# Patient Record
Sex: Female | Born: 2000 | Race: White | Hispanic: No | Marital: Single | Smoking: Never smoker
Health system: Southern US, Community
[De-identification: ages and names within clinical notes are randomized; demographics above are authoritative.]

## PROBLEM LIST (undated history)

## (undated) DIAGNOSIS — N946 Dysmenorrhea, unspecified: Secondary | ICD-10-CM

## (undated) DIAGNOSIS — F419 Anxiety disorder, unspecified: Secondary | ICD-10-CM

## (undated) DIAGNOSIS — F909 Attention-deficit hyperactivity disorder, unspecified type: Secondary | ICD-10-CM

## (undated) DIAGNOSIS — R5383 Other fatigue: Secondary | ICD-10-CM

## (undated) DIAGNOSIS — R519 Headache, unspecified: Secondary | ICD-10-CM

## (undated) DIAGNOSIS — K589 Irritable bowel syndrome without diarrhea: Secondary | ICD-10-CM

## (undated) DIAGNOSIS — K219 Gastro-esophageal reflux disease without esophagitis: Secondary | ICD-10-CM

## (undated) DIAGNOSIS — F32A Depression, unspecified: Secondary | ICD-10-CM

## (undated) DIAGNOSIS — F329 Major depressive disorder, single episode, unspecified: Secondary | ICD-10-CM

## (undated) DIAGNOSIS — F0781 Postconcussional syndrome: Secondary | ICD-10-CM

## (undated) DIAGNOSIS — J45909 Unspecified asthma, uncomplicated: Secondary | ICD-10-CM

## (undated) HISTORY — DX: Depression, unspecified: F32.A

## (undated) HISTORY — DX: Anxiety disorder, unspecified: F41.9

## (undated) HISTORY — DX: Headache, unspecified: R51.9

## (undated) HISTORY — DX: Unspecified asthma, uncomplicated: J45.909

## (undated) HISTORY — DX: Postconcussional syndrome: F07.81

## (undated) HISTORY — DX: Other fatigue: R53.83

## (undated) HISTORY — PX: CHOLECYSTECTOMY: SHX55

## (undated) HISTORY — DX: Dysmenorrhea, unspecified: N94.6

## (undated) HISTORY — DX: Attention-deficit hyperactivity disorder, unspecified type: F90.9

## (undated) HISTORY — PX: COLONOSCOPY WITH ESOPHAGOGASTRODUODENOSCOPY (EGD): SHX5779

---
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
## 2021-01-28 IMAGING — DX LEFT MIDDLE FINGER 2+V
3 series · 3 of 3 positions shown · non-contrast
Comparison: None.

CLINICAL DATA: Foreign body (needle) removed from finger

EXAM:
LEFT MIDDLE FINGER 2+V

[finger ap]
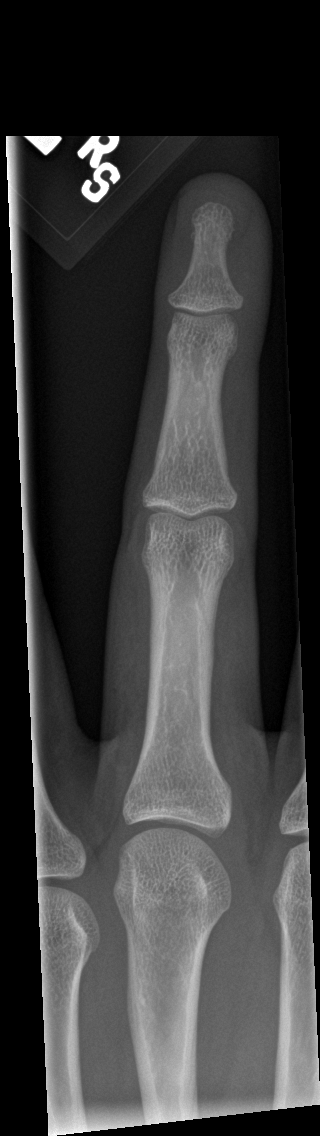

[finger obl]
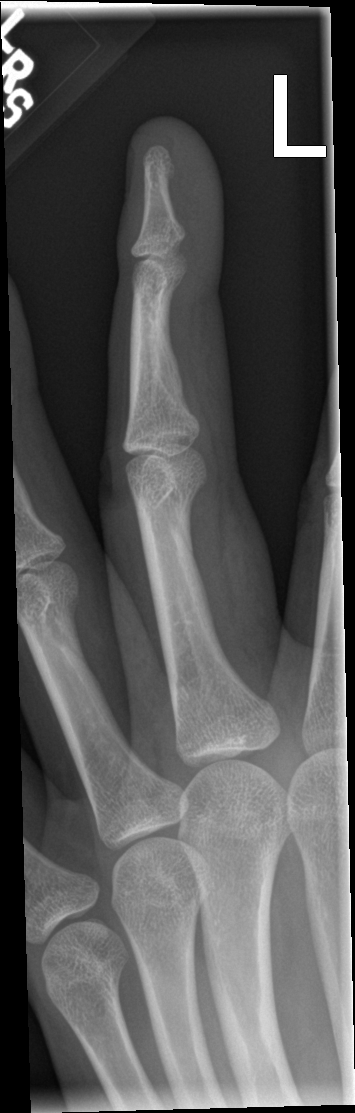

[finger lat]
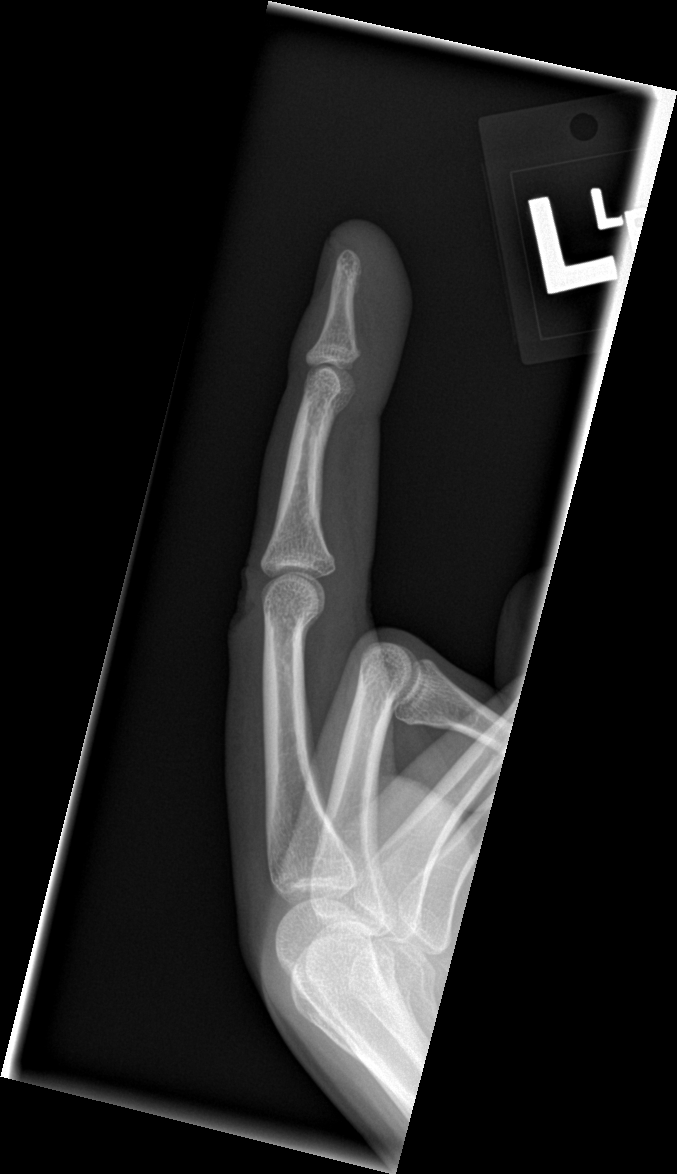

[3 of 3 positions shown; findings below may reference images not displayed]

FINDINGS: No fracture or dislocation is seen.

The joint spaces are preserved.

Mild ventral soft tissue swelling along the distal phalanx.

No radiopaque foreign body is seen.
IMPRESSION: No fracture, dislocation, or radiopaque foreign body is seen.

## 2021-04-19 ENCOUNTER — Other Ambulatory Visit: Payer: Self-pay

## 2021-04-19 ENCOUNTER — Ambulatory Visit: Payer: BC Managed Care – PPO | Admitting: Podiatry

## 2021-04-19 ENCOUNTER — Ambulatory Visit (INDEPENDENT_AMBULATORY_CARE_PROVIDER_SITE_OTHER): Payer: BC Managed Care – PPO

## 2021-04-19 ENCOUNTER — Encounter: Payer: Self-pay | Admitting: Podiatry

## 2021-04-19 DIAGNOSIS — M722 Plantar fascial fibromatosis: Secondary | ICD-10-CM

## 2021-04-19 MED ORDER — DICLOFENAC SODIUM 75 MG PO TBEC: 75.0000 mg | DELAYED_RELEASE_TABLET | Freq: Two times a day (BID) | ORAL | 2 refills | Status: DC

## 2021-04-19 NOTE — Progress Notes (Signed)
Subjective:   Patient ID: Stephanie Kaiser, female   DOB: 20 y.o.   MRN: [id number]   HPI Patient states she is getting pain in her heel left over right also has had trouble with her knees and other joints and did have blood work which did not indicate any form of systemic arthritis.  States the left is much worse than the right   ROS      Objective:  Physical Exam  Neurovascular status intact with patient found to have acute inflammation plantar aspect left heel at the insertional point of the tendon into the calcaneus with inflammation fluid noted with moderate discomfort right with moderate flatfoot deformity     Assessment:  Acute plantar fasciitis left over right also noted to have moderate elongation of the first metatarsal segment bilateral with functional hallux limitus with discomfort in the left first MPJ     Plan:  H&P reviewed all conditions and x-rays and today went ahead dispensed night splint with instructions on usage and also casted for functional orthotics with reverse Morton's extension to try to help reduce the stress on the heel arch and also reduce the stress on the first MPJ.  Patient will be seen back to recheck  X-rays indicate elongated first metatarsal segment left over right moderate depression of the arch

## 2021-04-19 NOTE — Patient Instructions (Signed)

## 2021-05-13 ENCOUNTER — Other Ambulatory Visit: Payer: Self-pay

## 2021-05-13 ENCOUNTER — Ambulatory Visit: Payer: BC Managed Care – PPO | Admitting: Podiatry

## 2021-05-13 ENCOUNTER — Encounter: Payer: Self-pay | Admitting: Podiatry

## 2021-05-13 DIAGNOSIS — M722 Plantar fascial fibromatosis: Secondary | ICD-10-CM | POA: Diagnosis not present

## 2021-05-13 MED ORDER — TRIAMCINOLONE ACETONIDE 10 MG/ML IJ SUSP
20.0000 mg | Freq: Once | INTRAMUSCULAR | Status: AC
  Administered 2021-05-13: 20 mg

## 2021-05-13 MED ORDER — PREDNISONE 10 MG PO TABS: ORAL_TABLET | ORAL | 0 refills | Status: DC

## 2021-05-15 NOTE — Progress Notes (Signed)
Subjective:   Patient ID: Stephanie Kaiser, female   DOB: 21 y.o.   MRN: [id number]   HPI Patient presents with mother stating the heels have been very sore still and the night splint seems to have helped a little bit but not a lot and she just cannot take the pain   ROS      Objective:  Physical Exam  Neurovascular status intact with acute discomfort of the plantar fascial left and the plantar fascial right     Assessment:  Acute Planter fasciitis bilateral     Plan:  Sterile prep injected the plantar fascial bilateral 3 mg Kenalog 5 mg Xylocaine placed on anti-inflammatory discussed continued night splint usage and orthotic should hopefully be here in the next 2 weeks

## 2021-05-20 ENCOUNTER — Telehealth: Payer: Self-pay | Admitting: Podiatry

## 2021-05-20 NOTE — Telephone Encounter (Signed)
Orthotics in... lvm for pt to call to schedule an appt. 

## 2021-05-27 ENCOUNTER — Encounter: Payer: Self-pay | Admitting: Podiatry

## 2021-05-27 ENCOUNTER — Ambulatory Visit: Payer: BC Managed Care – PPO

## 2021-05-27 ENCOUNTER — Other Ambulatory Visit: Payer: Self-pay

## 2021-05-27 ENCOUNTER — Ambulatory Visit: Payer: BC Managed Care – PPO | Admitting: Podiatry

## 2021-05-27 DIAGNOSIS — M722 Plantar fascial fibromatosis: Secondary | ICD-10-CM | POA: Diagnosis not present

## 2021-05-27 NOTE — Progress Notes (Signed)
SITUATION: Reason for Visit: Fitting and Delivery of Custom Fabricated Foot Orthoses Patient Report: Patient reports comfort and is satisfied with device.  OBJECTIVE DATA: Patient History / Diagnosis:     ICD-10-CM   1. Plantar fasciitis, bilateral  M72.2       Provided Device:  Custom Functional Foot Orthotics     [facility] Labs: [id number]  GOAL OF ORTHOSIS - Improve gait - Decrease energy expenditure - Improve Balance - Provide Triplanar stability of foot complex - Facilitate motion  ACTIONS PERFORMED Patient was fit with foot orthotics trimmed to shoe last. Patient tolerated fittign procedure.   Patient was provided with verbal and written instruction and demonstration regarding donning, doffing, wear, care, proper fit, function, purpose, cleaning, and use of the orthosis and in all related precautions and risks and benefits regarding the orthosis.  Patient was also provided with verbal instruction regarding how to report any failures or malfunctions of the orthosis and necessary follow up care. Patient was also instructed to contact our office regarding any change in status that may affect the function of the orthosis.  Patient demonstrated independence with proper donning, doffing, and fit and verbalized understanding of all instructions.  PLAN: Patient is to follow up in one week or as necessary (PRN). All questions were answered and concerns addressed. Plan of care was discussed with and agreed upon by the patient.

## 2021-05-28 NOTE — Progress Notes (Signed)
Subjective:   Patient ID: Stephanie Kaiser, female   DOB: 21 y.o.   MRN: [id number]   HPI Patient presents stating she is improved stating she is here to pick up orthotics and that the pain has responded quite well and is significantly reduced but does have discomfort if she does too much and continues to use night splint but needs a second 1 as it is too difficult to try to use 1 and then the other   ROS      Objective:  Physical Exam  Neurovascular status intact with minimal plantar aspect heel bilateral with inflammation fluid of the tendon that has improved     Assessment:  Plantar fasciitis bilateral improved but still present     Plan:  Advised on the continuation of anti-inflammatories and night splint and I dispensed a second night splint so that she does not have to use just 1 and can use 2 at the same time.  Orthotics dispensed all instructions given and will be seen back as needed

## 2021-08-09 ENCOUNTER — Telehealth: Payer: Self-pay | Admitting: Podiatry

## 2021-08-09 ENCOUNTER — Other Ambulatory Visit: Payer: Self-pay | Admitting: Podiatry

## 2021-08-09 MED ORDER — DICLOFENAC SODIUM 75 MG PO TBEC: 75.0000 mg | DELAYED_RELEASE_TABLET | Freq: Two times a day (BID) | ORAL | 2 refills | Status: DC

## 2021-08-09 NOTE — Telephone Encounter (Signed)
Called in.

## 2021-08-09 NOTE — Telephone Encounter (Signed)
Pharmacy [facility] in [place]  Medication diclofenac (VOLTAREN) 75 MG EC tablet   Patient called requesting refill on medication , please advise.

## 2021-08-09 NOTE — Telephone Encounter (Signed)
Please advise 

## 2021-08-19 ENCOUNTER — Encounter: Payer: Self-pay | Admitting: Podiatry

## 2021-08-19 ENCOUNTER — Ambulatory Visit: Payer: BC Managed Care – PPO | Admitting: Podiatry

## 2021-08-19 DIAGNOSIS — M722 Plantar fascial fibromatosis: Secondary | ICD-10-CM

## 2021-08-20 NOTE — Progress Notes (Signed)
Subjective:  ? ?Patient ID: Stephanie Kaiser, female   DOB: 21 y.o.   MRN: [id number]  ? ?HPI ?Patient states that she is quite a bit better she still has to take an anti-inflammatory periodically but overall she has had good improvement from the discomfort she has had ? ? ?ROS ? ? ?   ?Objective:  ?Physical Exam  ?Neurovascular status intact reduced discomfort in the plantar fascia bilateral still present but quite a bit better than previously ? ?   ?Assessment:  ?Planter fasciitis bilateral with improved but still present ? ?   ?Plan:  ?Reviewed gradual reduction in her anti-inflammatory continue night splint usage which she has not been doing and I encouraged in continued orthotic usage.  Reappoint as symptoms indicate ?   ? ? ?

## 2021-10-30 ENCOUNTER — Other Ambulatory Visit: Payer: Self-pay | Admitting: Podiatry

## 2021-10-30 NOTE — Telephone Encounter (Signed)
Should be fine to refill but does need to follow up with family doctor in future

## 2021-10-30 NOTE — Telephone Encounter (Signed)
I would like to see her before she gets it refilled again

## 2021-11-12 ENCOUNTER — Telehealth: Payer: Self-pay | Admitting: *Deleted

## 2021-11-12 ENCOUNTER — Other Ambulatory Visit: Payer: Self-pay | Admitting: *Deleted

## 2021-11-12 MED ORDER — DICLOFENAC SODIUM 75 MG PO TBEC: 75.0000 mg | DELAYED_RELEASE_TABLET | Freq: Two times a day (BID) | ORAL | 2 refills | Status: DC

## 2021-11-12 NOTE — Telephone Encounter (Signed)
Medication has been sent to pharmacy, patient notified thru voice message.

## 2021-11-12 NOTE — Telephone Encounter (Signed)
Patient is calling to request a refill on medication(diclofenac-75 mg), has an upcoming appointment but going out of town this weekend and would need prescription. Please advise

## 2021-11-25 ENCOUNTER — Ambulatory Visit: Payer: BC Managed Care – PPO | Admitting: Podiatry

## 2021-11-25 DIAGNOSIS — M722 Plantar fascial fibromatosis: Secondary | ICD-10-CM

## 2021-11-25 MED ORDER — DICLOFENAC SODIUM 75 MG PO TBEC: 75.0000 mg | DELAYED_RELEASE_TABLET | Freq: Two times a day (BID) | ORAL | 2 refills | Status: DC

## 2021-11-25 NOTE — Progress Notes (Signed)
Subjective:   Patient ID: Stephanie Kaiser, female   DOB: 21 y.o.   MRN: [id number]   HPI Patient presents stating she is doing better but she is still taking 1 diclofenac a day and is talk to her family physician about this who approves of this.  Patient states that overall her heels are not as bad as they were with the right one still been sore and she does have to wear the Christmas season Toy Shane Crutch was concerned about this   ROS      Objective:  Physical Exam  Neurovascular status found to be intact mild to moderate discomfort plantar fascial band right at the insertional point of the tendon into the calcaneus minimal on the left with patient improving and just taking 1 diclofenac a day     Assessment:  I do think we are showing improvement but there is still an area of pain right plantar fascia     Plan:  Reviewed condition and at this point organ to defer for about 3 months but we may do 1 more injection into the right plantar fascia before the end of the year in the Christmas season and I want her to continue to take 1 pill/day and she will is also work with her family physician on this.  Continue orthotic usage reappoint to recheck

## 2022-02-07 ENCOUNTER — Encounter: Payer: Self-pay | Admitting: Family Medicine

## 2022-02-27 ENCOUNTER — Ambulatory Visit
Admission: RE | Admit: 2022-02-27 | Discharge: 2022-02-27 | Disposition: A | Discharge status: Home / Self Care | Payer: BC Managed Care – PPO | Source: Ambulatory Visit | Attending: Family Medicine | Admitting: Family Medicine

## 2022-02-27 ENCOUNTER — Other Ambulatory Visit: Payer: Self-pay | Admitting: Family Medicine

## 2022-02-27 DIAGNOSIS — M79641 Pain in right hand: Secondary | ICD-10-CM

## 2022-03-10 ENCOUNTER — Encounter: Payer: Self-pay | Admitting: Podiatry

## 2022-03-10 ENCOUNTER — Ambulatory Visit: Payer: BC Managed Care – PPO | Admitting: Podiatry

## 2022-03-10 DIAGNOSIS — M722 Plantar fascial fibromatosis: Secondary | ICD-10-CM

## 2022-03-10 DIAGNOSIS — M76821 Posterior tibial tendinitis, right leg: Secondary | ICD-10-CM

## 2022-03-10 MED ORDER — TRIAMCINOLONE ACETONIDE 10 MG/ML IJ SUSP
20.0000 mg | Freq: Once | INTRAMUSCULAR | Status: AC
  Administered 2022-03-10: 20 mg

## 2022-03-11 NOTE — Progress Notes (Signed)
Subjective:   Patient ID: Stephanie Kaiser, female   DOB: 21 y.o.   MRN: [id number]   HPI Patient states overall she has been doing pretty good but she started to develop discomfort again right over left heel over the last month.  Overall satisfied with the orthotics and how she is doing   ROS      Objective:  Physical Exam  Neurovascular status intact with inflammation pain of the plantar fascia right over left with fluid buildup noted of the medial band at the insertion to the calcaneus     Assessment:  Acute Planter fasciitis which did well for over a 71-month.     Plan:  Reviewed this condition so far she is doing much better with our treatment plan and at this point I went ahead and I did do sterile prep and injected the fascia at insertion bilateral 3 mg Dexasone Kenalog 5 mg Xylocaine and advised on continued orthotic usage.  Patient will be seen back to recheck as needed

## 2022-03-12 ENCOUNTER — Emergency Department (HOSPITAL_BASED_OUTPATIENT_CLINIC_OR_DEPARTMENT_OTHER): Payer: BC Managed Care – PPO | Admitting: Radiology

## 2022-03-12 ENCOUNTER — Emergency Department (HOSPITAL_BASED_OUTPATIENT_CLINIC_OR_DEPARTMENT_OTHER)
Admission: EM | Admit: 2022-03-12 | Discharge: 2022-03-12 | Disposition: A | Discharge status: Home / Self Care | Payer: BC Managed Care – PPO | Attending: Emergency Medicine | Admitting: Emergency Medicine

## 2022-03-12 ENCOUNTER — Other Ambulatory Visit (HOSPITAL_BASED_OUTPATIENT_CLINIC_OR_DEPARTMENT_OTHER): Payer: Self-pay

## 2022-03-12 ENCOUNTER — Encounter (HOSPITAL_BASED_OUTPATIENT_CLINIC_OR_DEPARTMENT_OTHER): Payer: Self-pay

## 2022-03-12 ENCOUNTER — Other Ambulatory Visit: Payer: Self-pay

## 2022-03-12 DIAGNOSIS — M79644 Pain in right finger(s): Secondary | ICD-10-CM | POA: Diagnosis not present

## 2022-03-12 DIAGNOSIS — Z91040 Latex allergy status: Secondary | ICD-10-CM | POA: Diagnosis not present

## 2022-03-12 DIAGNOSIS — M7989 Other specified soft tissue disorders: Secondary | ICD-10-CM | POA: Diagnosis present

## 2022-03-12 LAB — BASIC METABOLIC PANEL
Anion gap: 9 (ref 5–15)
BUN: 13 mg/dL (ref 6–20)
CO2: 24 mmol/L (ref 22–32)
Calcium: 9.4 mg/dL (ref 8.9–10.3)
Chloride: 105 mmol/L (ref 98–111)
Creatinine, Ser: 0.53 mg/dL (ref 0.44–1.00)
GFR, Estimated: >60 mL/min (ref >60)
Glucose, Bld: 85 mg/dL (ref 70–99)
Potassium: 3.8 mmol/L (ref 3.5–5.1)
Sodium: 138 mmol/L (ref 135–145)

## 2022-03-12 LAB — CBC WITH DIFFERENTIAL/PLATELET
Abs Immature Granulocytes: 0.04 10*3/uL (ref 0.00–0.07)
Basophils Absolute: 0.1 10*3/uL (ref 0.0–0.1)
Basophils Relative: 1 %
Eosinophils Absolute: 0.1 10*3/uL (ref 0.0–0.5)
Eosinophils Relative: 1 %
HCT: 32.5 % — ABNORMAL LOW (ref 36.0–46.0)
Hemoglobin: 10.6 g/dL — ABNORMAL LOW (ref 12.0–15.0)
Immature Granulocytes: 0 %
Lymphocytes Relative: 23 %
Lymphs Abs: 2.7 10*3/uL (ref 0.7–4.0)
MCH: 28 pg (ref 26.0–34.0)
MCHC: 32.6 g/dL (ref 30.0–36.0)
MCV: 86 fL (ref 80.0–100.0)
Monocytes Absolute: 1.1 10*3/uL — ABNORMAL HIGH (ref 0.1–1.0)
Monocytes Relative: 9 %
Neutro Abs: 8.1 10*3/uL — ABNORMAL HIGH (ref 1.7–7.7)
Neutrophils Relative %: 66 %
Platelets: 277 10*3/uL (ref 150–400)
RBC: 3.78 MIL/uL — ABNORMAL LOW (ref 3.87–5.11)
RDW: 13 % (ref 11.5–15.5)
WBC: 12 10*3/uL — ABNORMAL HIGH (ref 4.0–10.5)
nRBC: 0 % (ref 0.0–0.2)

## 2022-03-12 MED ORDER — IBUPROFEN 400 MG PO TABS: 600.0000 mg | ORAL_TABLET | Freq: Once | ORAL | Status: DC

## 2022-03-12 MED ORDER — PREDNISONE 10 MG PO TABS
ORAL_TABLET | ORAL | 0 refills | Status: DC
  Filled 2022-03-12: qty 21, 6d supply, fill #0

## 2022-03-12 MED ORDER — ACETAMINOPHEN 500 MG PO TABS
1000.0000 mg | ORAL_TABLET | Freq: Once | ORAL | Status: AC
  Administered 2022-03-12: 1000 mg via ORAL
  Filled 2022-03-12: qty 2

## 2022-03-12 NOTE — ED Triage Notes (Signed)
Pt arrives from home via POV - reports recurrent pain to RUE 4th digit - pt does not recall trauma or injury prior to pain starting --negative outpt xray 02/26/22;  pain and edema worsened suddenly night of arrival.  RUE PMS intact; RUE 4th digit is tight and edematous.

## 2022-03-12 NOTE — ED Notes (Signed)
Hand surgeon now at bedside

## 2022-03-12 NOTE — ED Notes (Signed)
Discharge paperwork given and verbally understood. 

## 2022-03-12 NOTE — ED Provider Notes (Signed)
21 year old female seen and evaluated by Dr. Bernette Mayers with concern for flexor tenosynovitis of right hand. Dr. Janee Morn was consulted by hand surgery Dr. Janee Morn is seen and evaluated patient.  He had requested that chemistry, sed rate, and C-reactive protein be obtained. CBC and chemistry reviewed and within normal limits Dr. Janee Morn has prescribed a prednisone Dosepak He is given patient instructions. He is aware that sed rate and C-reactive protein have not resulted at this time and he will follow-up Given patient return precautions Patient appears stable for discharge from the ED   Margarita Grizzle, MD 03/12/22 [phone]

## 2022-03-12 NOTE — ED Provider Notes (Signed)
[facility] EMERGENCY DEPT  Provider Note  CSN: [id number] Arrival date & time: 03/12/22 0430  History Chief Complaint  Patient presents with   Hand Pain    Stephanie Kaiser is a 21 y.o. female presents to the ED complaining of pain in her R 4th finger. She had similar pain a few weeks ago, saw PCP and had labs done reportedly negative for RA or lupus. An xray done then was also negative. She reported improved pain and thought it had gone away but pain returned yesterday evening and worsened during the night. She has not had any injuries. No skin breaks or nail issues. No fever. Pain is worse with movement.    Home Medications Prior to Admission medications   Medication Sig Start Date End Date Taking? Authorizing Provider  predniSONE (DELTASONE) 10 MG tablet Take as directed over 6 days.  Do not take diclofenac during the 6 days taking prednisone. 03/12/22  Yes Mack Hook, MD  albuterol [facility] HFA) 108 [phone] Base) MCG/ACT inhaler 1 puff as needed 12/18/17   [provider]  atomoxetine (STRATTERA) 25 MG capsule Take 1 capsule (25 mg total) by mouth daily. 08/03/20   Corie Chiquito, PMHNP  diclofenac (VOLTAREN) 75 MG EC tablet Take 1 tablet (75 mg total) by mouth 2 (two) times daily. 11/12/21   McDonald, Rachelle Hora, DPM  diclofenac (VOLTAREN) 75 MG EC tablet Take 1 tablet (75 mg total) by mouth 2 (two) times daily. 11/25/21   Lenn Sink, DPM  DULoxetine (CYMBALTA) 30 MG capsule Take 1 capsule (30 mg total) by mouth at bedtime. 07/27/20   Corie Chiquito, PMHNP  etonogestrel-ethinyl estradiol (NUVARING) 0.12-0.015 MG/24HR vaginal ring 1 ring leave in place for 3 weeks, remove, and replace with a new ring after 7 day break 07/14/19   [provider]  omeprazole (PRILOSEC) 20 MG capsule  02/18/18   [provider]  ondansetron (ZOFRAN ODT) 4 MG disintegrating tablet Take 1 tablet (4 mg total) by mouth every 8 (eight) hours as needed for nausea or  vomiting. 10/22/20   Farrel Gordon, PA-C  predniSONE (DELTASONE) 10 MG tablet 12 day tapering dose 05/13/21   Lenn Sink, DPM  Promethazine HCl POWD See admin instructions. 03/03/18   [provider]  Suvorexant (BELSOMRA) 10 MG TABS Take 10 mg by mouth at bedtime as needed. 07/27/20   Corie Chiquito, PMHNP  Suvorexant (BELSOMRA) 15 MG TABS Take 15 mg by mouth at bedtime. 06/14/20   Corie Chiquito, PMHNP     Allergies    Cephalexin, Latex, and Penicillins   Review of Systems   Review of Systems Please see HPI for pertinent positives and negatives  Physical Exam BP (!) 91/57 (BP Location: Right Arm)   Pulse 67   Temp 98.6 F (37 C) (Oral)   Resp 16   Ht 5\' 8"  (1.727 m)   Wt 62.6 kg   LMP 02/05/2022 (Approximate)   SpO2 99%   BMI 20.98 kg/m   Physical Exam Vitals and nursing note reviewed.  HENT:     Head: Normocephalic.     Nose: Nose normal.  Eyes:     Extraocular Movements: Extraocular movements intact.  Pulmonary:     Effort: Pulmonary effort is normal.  Musculoskeletal:     Cervical back: Neck supple.     Comments: There is symmetric fusiform swelling of the R 4th finger, primarily around the PIP joint, held in partial flexion. Pain with passive flexion as well as flexion against  resistance. Some erythema on the palmar aspect of the finger, see photo below  Skin:    Findings: No rash (on exposed skin).  Neurological:     Mental Status: She is alert and oriented to person, place, and time.  Psychiatric:        Mood and Affect: Mood normal.     ED Results / Procedures / Treatments   EKG None  Procedures Procedures  Medications Ordered in the ED Medications  acetaminophen (TYLENOL) tablet 1,000 mg (1,000 mg Oral Given 03/12/22 [id number])    Initial Impression and Plan  Patient with exam concerning for a flexor tenosynovitis. No obvious source for an infectious cause by history or exam. Will recheck xray to compare to recent films and evaluate for  erosive signs. Will need Hand evaluation.   ED Course   Clinical Course as of 03/14/22 1630  Wed Mar 12, 2022  0603 I personally viewed the images from radiology studies and agree with radiologist interpretation: Xray shows soft tissue swelling only. Will discuss with Hand.   [CS]  [phone] Spoke with Dr. Grandville Silos, Hand, who requests labs be drawn including CBC, CRP and Sed rate and he will come to the ED to evaluate the patient. Motrin ordered for pain. Patient and mother at bedside aware of the plan.  [CS]  [phone] Patient requests APAP instead of Motrin.  [CS]  0700 Dr. Grandville Silos at bedside. Care of the patient will be signed out to Dr. Jeanell Sparrow at the change of shift.  [CS]  0745 Discussed with Dr. Grandville Silos who has seen and evaluated patient and will follow up.  He has given prednisone pack.  He will follow up in office and has given patient return precautions.  [DR]    Clinical Course User Index [CS] Truddie Hidden, MD [DR] Pattricia Boss, MD     MDM Rules/Calculators/A&P Medical Decision Making Problems Addressed: Finger swelling: acute illness or injury  Amount and/or Complexity of Data Reviewed Labs: ordered. Radiology: ordered and independent interpretation performed. Decision-making details documented in ED Course.  Risk OTC drugs. Prescription drug management.    Final Clinical Impression(s) / ED Diagnoses Final diagnoses:  Finger swelling    Rx / DC Orders ED Discharge Orders          Ordered    predniSONE (DELTASONE) 10 MG tablet        03/12/22 0732             Truddie Hidden, MD 03/14/22 1630

## 2022-03-12 NOTE — Consult Note (Addendum)
ORTHOPAEDIC CONSULTATION HISTORY & PHYSICAL REQUESTING PHYSICIAN: Truddie Hidden, MD  Chief Complaint: Swollen right ring finger  HPI: Stephanie Kaiser is a 21 y.o. female who presented for evaluation of the swollen and small right ring finger.  She presents accompanied by her mother.  She reports that the digit became a little swollen and sore about 2 weeks ago, without specific trauma, prompting some investigation with some laboratories drawn by her PCP.  She reports that the blood work was apparently not concerning.  This largely resolved, but then became swollen and increasingly painful beginning yesterday.  Again there is been no recent history of trauma nor discernible breaks in the skin.  She does have a family history of inflammatory disease, and she herself has been on diclofenac for the better part of the year due to multiple arthralgias and soft tissue pain.  Apparently all investigations for possible inflammatory causes have been negative.  There have been no joint aspirates for uric acid crystals however.  She has taken prednisone at times, which has been helpful in alleviating her symptoms.  Past Medical History:  Diagnosis Date   ADHD (attention deficit hyperactivity disorder)    Allergic rhinitis with asthma without status asthmaticus without complication    Anxiety    Asthma    Depression    Dysmenorrhea in adolescent    Fatigue    GERD (gastroesophageal reflux disease)    Headache    IBS (irritable bowel syndrome)    Post concussion syndrome    Past Surgical History:  Procedure Laterality Date   ADENOIDECTOMY  2004   CHOLECYSTECTOMY     COLONOSCOPY WITH ESOPHAGOGASTRODUODENOSCOPY (EGD)     TYMPANOSTOMY TUBE PLACEMENT  2004   Social History   Socioeconomic History   Marital status: Single    Spouse name: Not on file   Number of children: Not on file   Years of education: Not on file   Highest education level: 11th grade  Occupational History   Occupation:  student  Tobacco Use   Smoking status: Never   Smokeless tobacco: Never  Vaping Use   Vaping Use: Never used  Substance and Sexual Activity   Alcohol use: Never   Drug use: Never   Sexual activity: Not on file  Other Topics Concern   Not on file  Social History Narrative   Not on file   Social Determinants of Health   Financial Resource Strain: Not on file  Food Insecurity: Not on file  Transportation Needs: Not on file  Physical Activity: Not on file  Stress: Not on file  Social Connections: Not on file   Family History  Problem Relation Age of Onset   Depression Mother    Depression Father    Post-traumatic stress disorder Father    ADD / ADHD Brother    Depression Brother    Tics Brother    Alcohol abuse Paternal Aunt    Alcohol abuse Paternal Uncle    Alcohol abuse Paternal Grandfather    Allergies  Allergen Reactions   Cephalexin Nausea And Vomiting and Nausea Only   Latex Rash and Other (See Comments)   Penicillins Rash   Prior to Admission medications   Medication Sig Start Date End Date Taking? Authorizing Provider  albuterol (PROAIR HFA) 108 (90 Base) MCG/ACT inhaler 1 puff as needed 12/18/17   [provider]  atomoxetine (STRATTERA) 25 MG capsule Take 1 capsule (25 mg total) by mouth daily. 08/03/20   Thayer Headings, PMHNP  diclofenac (  VOLTAREN) 75 MG EC tablet Take 1 tablet (75 mg total) by mouth 2 (two) times daily. 11/12/21   McDonald, Stephan Minister, DPM  diclofenac (VOLTAREN) 75 MG EC tablet Take 1 tablet (75 mg total) by mouth 2 (two) times daily. 11/25/21   Wallene Huh, DPM  DULoxetine (CYMBALTA) 30 MG capsule Take 1 capsule (30 mg total) by mouth at bedtime. 07/27/20   Thayer Headings, PMHNP  etonogestrel-ethinyl estradiol (NUVARING) 0.12-0.015 MG/24HR vaginal ring 1 ring leave in place for 3 weeks, remove, and replace with a new ring after 7 day break 07/14/19   [provider]  omeprazole (PRILOSEC) 20 MG capsule  02/18/18   [provider]  ondansetron (ZOFRAN ODT) 4 MG disintegrating tablet Take 1 tablet (4 mg total) by mouth every 8 (eight) hours as needed for nausea or vomiting. 10/22/20   Alfredia Client, PA-C  predniSONE (DELTASONE) 10 MG tablet 12 day tapering dose 05/13/21   Wallene Huh, DPM  Promethazine HCl POWD See admin instructions. 03/03/18   [provider]  Suvorexant (BELSOMRA) 10 MG TABS Take 10 mg by mouth at bedtime as needed. 07/27/20   Thayer Headings, PMHNP  Suvorexant (BELSOMRA) 15 MG TABS Take 15 mg by mouth at bedtime. 06/14/20   Thayer Headings, PMHNP   DG Finger Ring Right  Result Date: 03/12/2022 CLINICAL DATA:  21 year old female with increasing right hand 4th finger pain this month, no known injury. EXAM: RIGHT RING FINGER 2+V COMPARISON:  Right hand series 02/27/2022. FINDINGS: Bone mineralization, joint spaces and alignment remain normal. There does appear to be 4th finger soft tissue swelling, mostly volar. No soft tissue gas. No radiopaque foreign body identified. No osseous abnormality identified. IMPRESSION: Soft tissue swelling with no osseous abnormality identified. Electronically Signed   By: Genevie Ann M.D.   On: 03/12/2022 05:58    Positive ROS: All other systems have been reviewed and were otherwise negative with the exception of those mentioned in the HPI and as above.  Physical Exam: Vitals: Refer to EMR.--Afebrile Constitutional:  WD, WN, NAD HEENT:  NCAT, EOMI Neuro/Psych:  Alert & oriented to person, place, and time; appropriate mood & affect Lymphatic: No generalized extremity edema or lymphadenopathy Extremities / MSK:  The extremities are normal with respect to appearance, ranges of motion, joint stability, muscle strength/tone, sensation, & perfusion except as otherwise noted:  The right ring finger is mildly swollen.  There are some tenderness in the volar soft tissues to palpation overlying largely the middle and proximal phalanges and somewhat into the palm.   The swelling is not fusiform.  The digit lies nearly fully extended and can be fully extended with only mild to moderate increase in soreness.  There is minimal tenderness proximal to the base of the digit.  The digit is brought into a more flexed position at the MP joint with the wrist flexed, taking tension off of the flexor tendon, there is still pain referable to the PIP joint with passive motion, and diminished motion daily as well.  WBC 12.0, ESR & CRP pending  Assessment: Right ring finger pain and swelling of unclear etiology.  Infectious etiology is very low probability  Plan: I discussed these findings with her and her mother.  I reviewed possibilities.  Infectious etiology is low.  Ultimately, after careful consideration and deliberation of multiple options ranging from observation, to anti-inflammatory treatment antibiotic treatment will combinations thereof,  we decided to proceed with a 6-day prednisone pack, holding diclofenac, and observing  for response.  She was encouraged that if her condition worsens over the next 24 to 48 hours to seek reevaluation, and given timing related to the holiday, this would require reevaluation in the emergency department.  She and her mom verbalized agreement with this plan, and a 6-day prednisone pack was electronically prescribed for her.  Questions were invited and answered.  Rayvon Char Grandville Silos, [address] [place], [place]  [address] Office: [phone] Mobile: [phone]  03/12/2022, 6:25 AM

## 2022-03-12 NOTE — Discharge Instructions (Addendum)
DO NOT TAKE DICLOFENAC DURING THE 6 DAYS YOU ARE TAKING PREDNISONE.  YOU CAN RESUME ON DAY 7  Return to the ED if your condition worsens over the next 24-48 hours.

## 2022-03-12 NOTE — ED Notes (Signed)
Pt reports taken Zanaflex 4mg  0200hrs and Diclofenac Sodium tab 75mg  approx 2200hrs

## 2022-03-12 NOTE — ED Notes (Signed)
Pt reports she prefers tylenol instead of motrin as she is on diclofenac and was advised not to take motrin - Dr Bernette Mayers notified and subsequently has ordered tylenol (see MAR for med administration)

## 2022-04-23 ENCOUNTER — Other Ambulatory Visit: Payer: BC Managed Care – PPO

## 2022-06-14 ENCOUNTER — Other Ambulatory Visit: Payer: Self-pay | Admitting: Podiatry

## 2022-07-11 ENCOUNTER — Other Ambulatory Visit: Payer: Self-pay | Admitting: Podiatry

## 2022-07-16 ENCOUNTER — Encounter: Payer: Self-pay | Admitting: Podiatry

## 2022-07-16 ENCOUNTER — Other Ambulatory Visit: Payer: Self-pay | Admitting: Podiatry

## 2022-07-16 MED ORDER — DICLOFENAC SODIUM 75 MG PO TBEC: 75.0000 mg | DELAYED_RELEASE_TABLET | Freq: Two times a day (BID) | ORAL | 2 refills | Status: DC

## 2022-07-23 ENCOUNTER — Ambulatory Visit: Payer: BC Managed Care – PPO | Admitting: Podiatry

## 2022-07-28 ENCOUNTER — Ambulatory Visit: Payer: BC Managed Care – PPO | Admitting: Podiatry

## 2022-07-28 ENCOUNTER — Encounter: Payer: Self-pay | Admitting: Podiatry

## 2022-07-28 ENCOUNTER — Ambulatory Visit (INDEPENDENT_AMBULATORY_CARE_PROVIDER_SITE_OTHER): Payer: BC Managed Care – PPO

## 2022-07-28 DIAGNOSIS — M722 Plantar fascial fibromatosis: Secondary | ICD-10-CM

## 2022-07-28 MED ORDER — TRIAMCINOLONE ACETONIDE 10 MG/ML IJ SUSP
10.0000 mg | Freq: Once | INTRAMUSCULAR | Status: AC
  Administered 2022-07-28: 10 mg

## 2022-07-28 MED ORDER — DICLOFENAC SODIUM 75 MG PO TBEC: 75.0000 mg | DELAYED_RELEASE_TABLET | Freq: Two times a day (BID) | ORAL | 2 refills | Status: DC

## 2022-07-29 NOTE — Progress Notes (Signed)
Subjective:   Patient ID: Stephanie Kaiser, female   DOB: 22 y.o.   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

## 2023-01-08 ENCOUNTER — Ambulatory Visit: Payer: BC Managed Care – PPO | Admitting: Podiatry

## 2023-02-12 ENCOUNTER — Other Ambulatory Visit: Payer: Self-pay

## 2023-02-12 ENCOUNTER — Other Ambulatory Visit (HOSPITAL_BASED_OUTPATIENT_CLINIC_OR_DEPARTMENT_OTHER): Payer: Self-pay

## 2023-02-12 MED ORDER — FLUTICASONE PROPIONATE 50 MCG/ACT NA SUSP
2.0000 | Freq: Every day | NASAL | 3 refills | Status: AC
  Filled 2023-02-12: qty 16, 25d supply, fill #0

## 2023-02-12 MED ORDER — LISDEXAMFETAMINE DIMESYLATE 10 MG PO CAPS
10.0000 mg | ORAL_CAPSULE | Freq: Every morning | ORAL | 0 refills | Status: DC
  Filled 2023-02-12: qty 30, 30d supply, fill #0

## 2023-02-12 MED ORDER — TIZANIDINE HCL 4 MG PO TABS
4.0000 mg | ORAL_TABLET | Freq: Every evening | ORAL | 0 refills | Status: AC | PRN
  Filled 2023-02-12: qty 90, 90d supply, fill #0

## 2023-03-04 ENCOUNTER — Encounter: Payer: Self-pay | Admitting: Psychiatry

## 2023-03-30 ENCOUNTER — Other Ambulatory Visit (HOSPITAL_BASED_OUTPATIENT_CLINIC_OR_DEPARTMENT_OTHER): Payer: Self-pay

## 2023-03-30 MED ORDER — DULOXETINE HCL 60 MG PO CPEP
60.0000 mg | ORAL_CAPSULE | Freq: Every day | ORAL | 3 refills | Status: DC
  Filled 2023-03-30: qty 30, 30d supply, fill #0

## 2023-03-30 MED ORDER — DICLOFENAC SODIUM 75 MG PO TBEC
75.0000 mg | DELAYED_RELEASE_TABLET | Freq: Every day | ORAL | 1 refills | Status: DC | PRN
  Filled 2023-03-30: qty 30, 30d supply, fill #0

## 2023-04-09 ENCOUNTER — Other Ambulatory Visit (HOSPITAL_BASED_OUTPATIENT_CLINIC_OR_DEPARTMENT_OTHER): Payer: Self-pay

## 2023-05-15 ENCOUNTER — Other Ambulatory Visit: Payer: Self-pay | Admitting: Nurse Practitioner

## 2023-05-15 ENCOUNTER — Other Ambulatory Visit (HOSPITAL_COMMUNITY)
Admission: RE | Admit: 2023-05-15 | Discharge: 2023-05-15 | Disposition: A | Discharge status: Home / Self Care | Payer: BC Managed Care – PPO | Source: Ambulatory Visit | Attending: Nurse Practitioner | Admitting: Nurse Practitioner

## 2023-05-15 ENCOUNTER — Other Ambulatory Visit: Payer: Self-pay

## 2023-05-15 ENCOUNTER — Other Ambulatory Visit (HOSPITAL_BASED_OUTPATIENT_CLINIC_OR_DEPARTMENT_OTHER): Payer: Self-pay

## 2023-05-15 DIAGNOSIS — Z124 Encounter for screening for malignant neoplasm of cervix: Secondary | ICD-10-CM | POA: Insufficient documentation

## 2023-05-15 MED ORDER — ETONOGESTREL-ETHINYL ESTRADIOL 0.12-0.015 MG/24HR VA RING
VAGINAL_RING | VAGINAL | 3 refills | Status: DC
  Filled 2023-05-15: qty 1, 28d supply, fill #0
  Filled 2023-05-15: qty 3, 84d supply, fill #0
  Filled 2023-06-17: qty 1, 28d supply, fill #1

## 2023-05-20 LAB — CYTOLOGY - PAP: Diagnosis: NEGATIVE

## 2023-06-08 ENCOUNTER — Other Ambulatory Visit (HOSPITAL_BASED_OUTPATIENT_CLINIC_OR_DEPARTMENT_OTHER): Payer: Self-pay

## 2023-06-08 MED ORDER — DICLOFENAC SODIUM 75 MG PO TBEC
75.0000 mg | DELAYED_RELEASE_TABLET | Freq: Every day | ORAL | 1 refills | Status: DC | PRN
  Filled 2023-06-08: qty 30, 30d supply, fill #0

## 2023-06-08 MED ORDER — DULOXETINE HCL 60 MG PO CPEP
60.0000 mg | ORAL_CAPSULE | Freq: Every day | ORAL | 3 refills | Status: DC
  Filled 2023-06-08: qty 30, 30d supply, fill #0

## 2023-06-08 MED ORDER — PREDNISONE 20 MG PO TABS
ORAL_TABLET | ORAL | 0 refills | Status: DC
  Filled 2023-06-08: qty 18, 9d supply, fill #0

## 2023-06-17 ENCOUNTER — Other Ambulatory Visit (HOSPITAL_BASED_OUTPATIENT_CLINIC_OR_DEPARTMENT_OTHER): Payer: Self-pay

## 2023-06-18 ENCOUNTER — Other Ambulatory Visit: Payer: Self-pay

## 2023-06-18 ENCOUNTER — Other Ambulatory Visit (HOSPITAL_BASED_OUTPATIENT_CLINIC_OR_DEPARTMENT_OTHER): Payer: Self-pay

## 2023-06-19 ENCOUNTER — Other Ambulatory Visit (HOSPITAL_BASED_OUTPATIENT_CLINIC_OR_DEPARTMENT_OTHER): Payer: Self-pay

## 2023-06-20 ENCOUNTER — Other Ambulatory Visit (HOSPITAL_BASED_OUTPATIENT_CLINIC_OR_DEPARTMENT_OTHER): Payer: Self-pay

## 2023-06-29 ENCOUNTER — Other Ambulatory Visit (HOSPITAL_BASED_OUTPATIENT_CLINIC_OR_DEPARTMENT_OTHER): Payer: Self-pay

## 2024-03-30 NOTE — Progress Notes (Signed)
 "  Office Visit Note  Patient: Stephanie Kaiser             Date of Birth: 2000-10-09           MRN: [id number]             PCP: Teresa Channel, MD Referring: Teresa Channel, MD Visit Date: 04/01/2024 Occupation: Data Unavailable  Subjective:  Discussed the use of AI scribe software for clinical note transcription with the patient, who gave verbal consent to proceed.  History of Present Illness   Stephanie Kaiser is a 23 year old female who presents with episodic joint swelling and pain associated with elevated sed rate and CRP and positive HLA-B27 allele.  She has experienced episodic joint swelling and pain for approximately three years, initially affecting her right ring finger, which led to an ER visit and a prescription for steroids. Blood work was conducted to rule out infection. She saw a hand surgeon who treated her for a suspected ruptured tendon, and she underwent physical therapy, which provided some relief.  Over the past three years, she has had similar swelling episodes in various joints, including her toes and fingers, without identifiable injury. These episodes last two to three months, with unbearable pain for about two weeks before gradually improving. She manages the pain with braces and by limiting movement.  She experiences occasional knee swelling, requiring a knee brace. The pain worsens with weight-bearing activities and can be constant when severe. She has been taking ibuprofen  consistently for the past six months, which provides some relief. Prior to this, she was on diclofenac .  No significant redness or rash is associated with the swelling, though mild redness occurs when the swelling is severe. She reports general joint stiffness and cold hands, but no color changes. She has a history of plantar fasciitis, treated with injections approximately three years ago.  Her family history includes arthritis in her mother and gout in her grandfather. She denies any  significant past illnesses or infections related to her symptoms. She reports small cuts around her nose but no other skin changes or rashes.  She notes hip pain, particularly on the left side, present for about a year. This pain prevents her from sleeping on her left side and worsens after periods of inactivity, improving after about five minutes of movement. No specific injury to the hip is reported.  She works at [employer], involving physical activities such as lifting and computer work. 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     She has not noticed any new vision changes or eye pain or inflammation. Experiences some mixed constipation and diarrhea without seeing frank blood or mucus stools.   Labs reviewed 05/2023 HLA-B27 pos ESR 33 CRP 23 RF neg CCP neg  Activities of Daily Living:  Patient reports morning stiffness for 30-40 minutes.   Patient Reports nocturnal pain.  Difficulty dressing/grooming: Reports Difficulty climbing stairs: Reports Difficulty getting out of chair: Reports Difficulty using hands for taps, buttons, cutlery, and/or writing: Reports  Review of Systems  Constitutional:  Positive for fatigue.  HENT:  Negative for mouth sores and mouth dryness.   Eyes:  Negative for dryness.  Respiratory:  Positive for shortness of breath.   Cardiovascular:  Positive for palpitations. Negative for chest pain.  Gastrointestinal:  Positive for constipation and diarrhea. Negative for blood in stool.  Endocrine: Negative for increased urination.  Genitourinary:  Negative for involuntary urination.  Musculoskeletal:  Positive for joint pain, gait problem, joint pain, joint swelling, myalgias, morning stiffness and myalgias. Negative for muscle weakness and muscle tenderness.  Skin:  Negative for color change, rash, hair loss and sensitivity to sunlight.  Allergic/Immunologic: Positive for susceptible to infections.  Neurological:   Positive for headaches. Negative for dizziness.  Hematological:  Negative for swollen glands.  Psychiatric/Behavioral:  Positive for depressed mood and sleep disturbance. The patient is nervous/anxious.     PMFS History:  Patient Active Problem List   Diagnosis Date Noted   Acne vulgaris 04/01/2024   Allergic rhinitis 04/01/2024   Anxiety 04/01/2024   Inflammatory arthritis 04/01/2024   Irregular menses 04/01/2024   Migraine 04/01/2024   Mild intermittent asthma 04/01/2024   Plantar fascial fibromatosis 04/01/2024   Vitamin B12 deficiency (non anemic) 04/01/2024   Vitamin D deficiency 04/01/2024   Dysmenorrhea 04/01/2024   Gastroesophageal reflux disease without esophagitis 04/01/2024   Elevated sed rate 04/01/2024   Chronic low back pain 04/01/2024   Inflammatory back pain 04/01/2024   Panic disorder 04/13/2019   Generalized anxiety disorder 04/13/2019   Social anxiety disorder 04/13/2019   Attention deficit hyperactivity disorder (ADHD), inattentive type, 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   Persistent depressive disorder with atypical features, 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   Postconcussional syndrome 07/15/2017   Acute post-traumatic headache, not intractable 07/15/2017   Poor concentration 07/15/2017    Past Medical History:  Diagnosis Date   ADHD (attention deficit hyperactivity disorder)    Allergic rhinitis with asthma without status asthmaticus without complication    Anxiety    Asthma    Depression    Dysmenorrhea in adolescent    Fatigue    GERD (gastroesophageal reflux disease)    Headache    IBS (irritable bowel syndrome)    Post concussion syndrome     Family History  Problem Relation Age of Onset   Depression Mother    Depression Father    Post-traumatic stress disorder Father    ADD / ADHD Brother    Depression Brother    Tics Brother    Alcohol abuse Paternal Aunt    Alcohol abuse Paternal Uncle    Alcohol abuse Paternal Grandfather    Past Surgical History:   Procedure Laterality Date   ADENOIDECTOMY  2004   CHOLECYSTECTOMY     COLONOSCOPY WITH ESOPHAGOGASTRODUODENOSCOPY (EGD)     TYMPANOSTOMY TUBE PLACEMENT  2004   Social History   Tobacco Use   Smoking status: Never    Passive exposure: Never   Smokeless tobacco: Never  Vaping Use   Vaping status: Never Used  Substance Use Topics   Alcohol use: Never   Drug use: Never   Social History   Social History Narrative   Not on file     Immunization History  Administered Date(s) Administered   DTaP 11/09/2001, 01/03/2002, 03/30/2002, 12/14/2002, 11/28/2005   Fluzone Influenza virus vaccine,trivalent (IIV3), split virus 02/17/2003, 02/13/2004, 02/17/2005, 01/30/2006, 03/17/2007, 03/01/2008, 03/01/2009, 03/01/2010, 03/03/2011, 02/02/2012, 02/28/2013, 01/16/2014, 02/08/2015, 04/02/2016, 02/02/2017   HIB (PRP-T) 11/09/2001, 01/03/2002, 03/30/2002, 12/14/2002   HPV 9-valent 04/12/2014, 06/15/2014, 11/07/2014   Hepatitis A, Ped/Adol-2 Dose 11/28/2005, 12/02/2006   Hepatitis B, PED/ADOLESCENT 09/10/01, 10/12/2001, 07/04/2002   IPV 11/09/2001, 01/03/2002, 11/28/2005   Influenza, Quadrivalent, Recombinant, Inj, Pf 02/10/2019   Influenza,inj,Quad PF,6+ Mos 04/23/2018   Influenza,trivalent, recombinat, inj, PF 02/12/2023   MMR 10/03/2002, 11/28/2005   Meningococcal Acwy, Unspecified 10/15/2012   Meningococcal B, OMV 02/10/2019, 06/29/2019   Meningococcal Conjugate 02/10/2019   Moderna Sars-Covid-2 Vaccination 04/26/2020  Pfizer(Comirnaty)Fall Seasonal Vaccine 12 years and older 07/26/2019, 08/16/2019   Pneumococcal Conjugate PCV 7 11/09/2001, 01/03/2002, 12/14/2002, 09/26/2003   Tdap 10/15/2012, 11/16/2018   Varicella 10/03/2002, 11/28/2005     Objective: Vital Signs: BP 94/62 (BP Location: Right Arm, Patient Position: Sitting, Cuff Size: Normal)   Pulse 69   Temp 98 F (36.7 C)   Resp 15   Ht 5' 7.5 (1.715 m)   Wt 136 lb 12.8 oz (62.1 kg)   BMI 21.11 kg/m    Physical  Exam Eyes:     Conjunctiva/sclera: Conjunctivae normal.  Cardiovascular:     Rate and Rhythm: Normal rate and regular rhythm.  Pulmonary:     Effort: Pulmonary effort is normal.     Breath sounds: Normal breath sounds.  Musculoskeletal:     Right lower leg: No edema.     Left lower leg: No edema.  Lymphadenopathy:     Cervical: No cervical adenopathy.  Skin:    General: Skin is warm and dry.     Findings: No rash.  Neurological:     Mental Status: She is alert.  Psychiatric:        Mood and Affect: Mood normal.      Musculoskeletal Exam:  Shoulders full ROM no tenderness or swelling Elbows full ROM no tenderness or swelling Wrists full ROM no tenderness or swelling Fingers full ROM no tenderness or swelling Knees full ROM no tenderness or swelling Nodules at Achilles tendon insertion on right foot. Decreased range of motion in right big toe.     Investigation: No additional findings.  Imaging: No results found.  Recent Labs: Lab Results  Component Value Date   WBC 6.5 04/01/2024   HGB 12.2 04/01/2024   PLT 260 04/01/2024   NA 138 04/01/2024   K 4.3 04/01/2024   CL 104 04/01/2024   CO2 27 04/01/2024   GLUCOSE 85 04/01/2024   BUN 13 04/01/2024   CREATININE 0.51 04/01/2024   BILITOT 0.5 04/01/2024   AST 22 04/01/2024   ALT 15 04/01/2024   PROT 7.3 04/01/2024   CALCIUM 9.2 04/01/2024    Speciality Comments: No specialty comments available.  Procedures:  No procedures performed Allergies: Cephalexin, Latex, and Penicillins   Assessment / Plan:     Visit Diagnoses:  Assessment & Plan Inflammatory arthritis Inflammatory back pain Elevated sed rate Intermittent swelling and pain in fingers, toes, and knees for three years. High sedimentation rate indicates inflammation. Differential most suspicious for axial spondyloarthritis/ERA. Negative for RA antibodies. Ibuprofen  provides some relief. Previous doxycycline trial discontinued due to side effects. No  evidence of gout. - Ordered blood tests to recheck inflammatory markers. - Continue ibuprofen  for symptom management. - Consider sulfasalazine next step if symptoms worsen or not adequate control with NSAIDs.   Orders:   Sedimentation rate   C-reactive protein   CBC with Differential/Platelet   Comprehensive metabolic panel with GFR   XR Pelvis 1-2 Views   XR Lumbar Spine 2-3 Views  Plantar fascial fibromatosis Recurrent plantar fasciitis with previous injections. Pain exacerbated by prolonged standing or walking. Possible contribution from altered gait due to big toe mobility issues. - Continue current management with braces and rest as needed.    Vitamin D deficiency     Chronic bilateral low back pain without sciatica Chronic bilateral hip pain, more pronounced on the left side, persisting for about a year. Pain worsens with prolonged sitting and improves with movement. Differential includes axial spondyloarthritis, tendinitis, bursitis, or labral pathology.  Excellent range of motion suggests against osteoarthritis. - Ordered x-ray of hips and pelvis to evaluate for structural abnormalities. - Consider further evaluation with ultrasound if symptoms persist or worsen.        Follow-Up Instructions: Return in about 6 weeks (around 05/13/2024) for New pt ?AS on NSAIDs f/u 1-72mos.   Lonni LELON Ester, MD  Note - This record has been created using [employer].  Chart creation errors have been sought, but may not always  have been located. Such creation errors do not reflect on  the standard of medical care. "

## 2024-04-01 ENCOUNTER — Encounter: Payer: Self-pay | Admitting: Internal Medicine

## 2024-04-01 ENCOUNTER — Ambulatory Visit: Attending: Internal Medicine | Admitting: Internal Medicine

## 2024-04-01 ENCOUNTER — Ambulatory Visit

## 2024-04-01 VITALS — BP 94/62 | HR 69 | Temp 98.0°F | Resp 15 | Ht 67.5 in | Wt 136.8 lb

## 2024-04-01 DIAGNOSIS — F419 Anxiety disorder, unspecified: Secondary | ICD-10-CM | POA: Insufficient documentation

## 2024-04-01 DIAGNOSIS — G8929 Other chronic pain: Secondary | ICD-10-CM | POA: Diagnosis not present

## 2024-04-01 DIAGNOSIS — R700 Elevated erythrocyte sedimentation rate: Secondary | ICD-10-CM | POA: Diagnosis not present

## 2024-04-01 DIAGNOSIS — N946 Dysmenorrhea, unspecified: Secondary | ICD-10-CM | POA: Insufficient documentation

## 2024-04-01 DIAGNOSIS — M5489 Other dorsalgia: Secondary | ICD-10-CM

## 2024-04-01 DIAGNOSIS — E538 Deficiency of other specified B group vitamins: Secondary | ICD-10-CM | POA: Insufficient documentation

## 2024-04-01 DIAGNOSIS — M1380 Other specified arthritis, unspecified site: Secondary | ICD-10-CM

## 2024-04-01 DIAGNOSIS — J309 Allergic rhinitis, unspecified: Secondary | ICD-10-CM | POA: Insufficient documentation

## 2024-04-01 DIAGNOSIS — J4520 Mild intermittent asthma, uncomplicated: Secondary | ICD-10-CM | POA: Insufficient documentation

## 2024-04-01 DIAGNOSIS — K219 Gastro-esophageal reflux disease without esophagitis: Secondary | ICD-10-CM | POA: Insufficient documentation

## 2024-04-01 DIAGNOSIS — M722 Plantar fascial fibromatosis: Secondary | ICD-10-CM | POA: Diagnosis not present

## 2024-04-01 DIAGNOSIS — N926 Irregular menstruation, unspecified: Secondary | ICD-10-CM | POA: Insufficient documentation

## 2024-04-01 DIAGNOSIS — E559 Vitamin D deficiency, unspecified: Secondary | ICD-10-CM | POA: Insufficient documentation

## 2024-04-01 DIAGNOSIS — L700 Acne vulgaris: Secondary | ICD-10-CM | POA: Insufficient documentation

## 2024-04-01 DIAGNOSIS — G43909 Migraine, unspecified, not intractable, without status migrainosus: Secondary | ICD-10-CM | POA: Insufficient documentation

## 2024-04-01 NOTE — Assessment & Plan Note (Addendum)
 Recurrent plantar fasciitis with previous injections. Pain exacerbated by prolonged standing or walking. Possible contribution from altered gait due to big toe mobility issues. - Continue current management with braces and rest as needed.

## 2024-04-01 NOTE — Assessment & Plan Note (Addendum)
 Intermittent swelling and pain in fingers, toes, and knees for three years. High sedimentation rate indicates inflammation. Differential most suspicious for axial spondyloarthritis/ERA. Negative for RA antibodies. Ibuprofen  provides some relief. Previous doxycycline trial discontinued due to side effects. No evidence of gout. - Ordered blood tests to recheck inflammatory markers. - Continue ibuprofen  for symptom management. - Consider sulfasalazine next step if symptoms worsen or not adequate control with NSAIDs.   Orders:   Sedimentation rate   C-reactive protein   CBC with Differential/Platelet   Comprehensive metabolic panel with GFR   XR Pelvis 1-2 Views   XR Lumbar Spine 2-3 Views

## 2024-04-01 NOTE — Assessment & Plan Note (Addendum)
 Stephanie Kaiser

## 2024-04-01 NOTE — Assessment & Plan Note (Addendum)
 Chronic bilateral hip pain, more pronounced on the left side, persisting for about a year. Pain worsens with prolonged sitting and improves with movement. Differential includes axial spondyloarthritis, tendinitis, bursitis, or labral pathology. Excellent range of motion suggests against osteoarthritis. - Ordered x-ray of hips and pelvis to evaluate for structural abnormalities. - Consider further evaluation with ultrasound if symptoms persist or worsen.

## 2024-04-02 LAB — CBC WITH DIFFERENTIAL/PLATELET
Absolute Lymphocytes: 2061 {cells}/uL (ref 850–3900)
Absolute Monocytes: 663 {cells}/uL (ref 200–950)
Basophils Absolute: 72 {cells}/uL (ref 0–200)
Basophils Relative: 1.1 %
Eosinophils Absolute: 143 {cells}/uL (ref 15–500)
Eosinophils Relative: 2.2 %
HCT: 37.6 % (ref 35.9–46.0)
Hemoglobin: 12.2 g/dL (ref 11.7–15.5)
MCH: 28.4 pg (ref 27.0–33.0)
MCHC: 32.4 g/dL (ref 31.6–35.4)
MCV: 87.4 fL (ref 81.4–101.7)
MPV: 10.6 fL (ref 7.5–12.5)
Monocytes Relative: 10.2 %
Neutro Abs: 3562 {cells}/uL (ref 1500–7800)
Neutrophils Relative %: 54.8 %
Platelets: 260 Thousand/uL (ref 140–400)
RBC: 4.3 Million/uL (ref 3.80–5.10)
RDW: 11.8 % (ref 11.0–15.0)
Total Lymphocyte: 31.7 %
WBC: 6.5 Thousand/uL (ref 3.8–10.8)

## 2024-04-02 LAB — COMPREHENSIVE METABOLIC PANEL WITH GFR
AG Ratio: 1.4 (calc) (ref 1.0–2.5)
ALT: 15 U/L (ref 6–29)
AST: 22 U/L (ref 10–30)
Albumin: 4.3 g/dL (ref 3.6–5.1)
Alkaline phosphatase (APISO): 58 U/L (ref 31–125)
BUN: 13 mg/dL (ref 7–25)
CO2: 27 mmol/L (ref 20–32)
Calcium: 9.2 mg/dL (ref 8.6–10.2)
Chloride: 104 mmol/L (ref 98–110)
Creat: 0.51 mg/dL (ref 0.50–0.96)
Globulin: 3 g/dL (ref 1.9–3.7)
Glucose, Bld: 85 mg/dL (ref 65–99)
Potassium: 4.3 mmol/L (ref 3.5–5.3)
Sodium: 138 mmol/L (ref 135–146)
Total Bilirubin: 0.5 mg/dL (ref 0.2–1.2)
Total Protein: 7.3 g/dL (ref 6.1–8.1)
eGFR: 135 mL/min/1.73m2 (ref >=60)

## 2024-04-02 LAB — SEDIMENTATION RATE: Sed Rate: 14 mm/h (ref 0–20)

## 2024-04-02 LAB — C-REACTIVE PROTEIN: CRP: <3 mg/L (ref <8.0)

## 2024-04-15 ENCOUNTER — Telehealth: Payer: Self-pay | Admitting: Internal Medicine

## 2024-04-15 DIAGNOSIS — M1380 Other specified arthritis, unspecified site: Secondary | ICD-10-CM

## 2024-04-15 NOTE — Telephone Encounter (Signed)
 Pt called stated she is in pain on the top of her right foot. Pt stated she cannot walk it is exteremly painful. Pt was offered to come in 04/29/24 with [facility] but declined. Pt would like to know if there is something that could help.

## 2024-04-19 NOTE — Telephone Encounter (Signed)
 Patient advised to take a non-steroidal antiinflammatory, such as ibuprofen  800 mg up to 3 times a day or naproxen 500 mg twice a day. She can also ice the top of her foot multiple times a day. If that does not improve the pain after a few days, please let us  know & we can prescribe a stronger antiinflammatory. Patient states the pain did get significantly worse that day and she did end up going to urgent care that day. Patient states she was placed on prednisone  which has helped some. Patient states states she does have a follow up with orthopedics tomorrow.

## 2024-04-22 NOTE — Telephone Encounter (Signed)
 Patient contacted the office and states she is still in pain. Advised the patient both Dr. Jeannetta and his PA are out of office until Monday. Advised the patient to revisit urgent care if having too much pain and not being able to move around. Patient did inquire about an earlier appointment. Moved patient appointment to 05/03/2024. Please advise.

## 2024-04-25 ENCOUNTER — Other Ambulatory Visit (HOSPITAL_BASED_OUTPATIENT_CLINIC_OR_DEPARTMENT_OTHER): Payer: Self-pay

## 2024-04-25 MED ORDER — SULFASALAZINE 500 MG PO TBEC
500.0000 mg | DELAYED_RELEASE_TABLET | Freq: Two times a day (BID) | ORAL | 0 refills | Status: AC
  Filled 2024-04-25: qty 60, 30d supply, fill #0

## 2024-04-25 MED ORDER — MELOXICAM 15 MG PO TABS
15.0000 mg | ORAL_TABLET | Freq: Every day | ORAL | 0 refills | Status: AC
  Filled 2024-04-25 – 2024-05-02 (×3): qty 30, 30d supply, fill #0

## 2024-04-25 NOTE — Addendum Note (Signed)
 Addended by: JEANNETTA LONNI ORN on: 04/25/2024 01:17 PM   Modules accepted: Orders

## 2024-04-25 NOTE — Telephone Encounter (Signed)
 I spoke with Stephanie Kaiser she still has ongoing symptoms with increased pain and swelling affecting her right foot on the top of the foot and on the 3rd and 4th toes.  This started since December 26.  She was seen once at [facility] urgent care and treated with an 8-day prednisone  taper that improved symptoms which quickly returned after finishing the medication.  She called back to our office and was recommended to take high-dose ibuprofen  or Aleve to follow-up the steroids but did not see much relief on this treatment.  Subsequently she saw the orthopedic urgent care and was prescribed meloxicam  and given a boot.  She is now 3 days on the meloxicam  and feels symptoms are partially improving.  She rescheduled her follow-up appointment to the 13th but is concerned about running out of the current prescription and also limited response in symptoms as she has a trip out of state planned in 2 weeks.  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  We will go ahead and try starting this at 500 mg EC twice daily as well as the meloxicam  15 mg daily for inflammation.  We can recheck her symptom response and medication tolerance at our follow-up on the 13th.

## 2024-04-25 NOTE — Telephone Encounter (Signed)
 Patient contacted the office stating she is still having a flare and is still in pain. Please advise.

## 2024-04-29 ENCOUNTER — Other Ambulatory Visit (HOSPITAL_BASED_OUTPATIENT_CLINIC_OR_DEPARTMENT_OTHER): Payer: Self-pay

## 2024-05-02 ENCOUNTER — Other Ambulatory Visit (HOSPITAL_BASED_OUTPATIENT_CLINIC_OR_DEPARTMENT_OTHER): Payer: Self-pay

## 2024-05-02 NOTE — Assessment & Plan Note (Signed)
 Chronic bilateral hip pain, more pronounced on the left side, persisting for about a year. Pain worsens with prolonged sitting and improves with movement. Differential includes axial spondyloarthritis, tendinitis, bursitis, or labral pathology. Excellent range of motion suggests against osteoarthritis.  ***

## 2024-05-02 NOTE — Assessment & Plan Note (Signed)
 Symptoms most suspicious for axial spondyloarthritis. Symptoms remain uncontrolled despite NSAIDs and several prednisone  tapers. We will start sulfasalazine  *** today. Risks and benefits of sulfasalazine  reviewed. She is not sulfa allergic that she knows of, but will stop the medication and inform me immediately if a rash develops (typically about a week after starting the medication). Discussed common side effects, including GI symptoms, as well as serious side effects including allergic reaction, cytopenias, and elevated LFTs.

## 2024-05-02 NOTE — Progress Notes (Unsigned)
 "  Office Visit Note  Patient: Stephanie Kaiser             Date of Birth: January 10, 2001           MRN: [id number]             Visit Date: 05/03/2024  PCP: Teresa Channel, MD   Subjective:   Chief Complaint: Pain (Right foot flare)  History of Present Illness: Stephanie Kaiser is a 24 y.o. female who returns today for  evaluation of pain and swelling in her left foot. Duration of symptoms is 2.5 weeks. She was seen at urgent care and prescribed a week taper of prednisone . This improved her pain while she was at the beginning of the taper but as she decreased it, the pain came back. She was then seen at orthopedic urgent care and was prescribed meloxicam  and an additional dose of prednisone , however she started the meloxicam  daily and did not take the prednisone . Overall the prednisone  did improve her symptoms, but she is still endorsing pain and swelling. She is currently ambulating with a limp. She was using crutches a few weeks ago. She is going on a business trip this weekend and is concerned about the ability to walk on her trip.  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 She started on sulfasalazine  last week, but immediately had diarrhea and a very bad headache after taking it for a few days. She since stopped. Pain is still present in her lower back and hips.  Previous Medication Trials: none.  Last Labs: 04/01/2024 - Sed Rate/CRP WNL, CBC WNL, CMP WNL 05/2023 - HLA-B27 pos, ESR 33, CRP 23, RF neg, CCP neg  Review of Systems: Review of Systems  Constitutional:  Positive for fatigue.  HENT:  Negative for mouth sores and mouth dryness.   Eyes:  Positive for dryness.  Respiratory:  Positive for shortness of breath.   Cardiovascular:  Negative for chest pain.  Gastrointestinal:  Positive for diarrhea. Negative for blood in stool.  Endocrine: Negative for increased urination.   Genitourinary:  Negative for involuntary urination.  Musculoskeletal:  Positive for joint pain, joint pain, joint swelling, myalgias, muscle weakness, morning stiffness, muscle tenderness and myalgias. Negative for gait problem.  Skin:  Negative for color change, rash, hair loss and sensitivity to sunlight.  Allergic/Immunologic: Positive for susceptible to infections.  Neurological:  Positive for headaches. Negative for dizziness.  Hematological:  Negative for swollen glands.  Psychiatric/Behavioral:  Positive for sleep disturbance. Negative for depressed mood. The patient is nervous/anxious.     Medication List: Current Medications[1]   Allergies:  Cephalexin, Latex, and Penicillins  Immunization status:  Immunization History  Administered Date(s) Administered   DTaP 11/09/2001, 01/03/2002, 03/30/2002, 12/14/2002, 11/28/2005   Fluzone Influenza virus vaccine,trivalent (IIV3), split virus 02/17/2003, 02/13/2004, 02/17/2005, 01/30/2006, 03/17/2007, 03/01/2008, 03/01/2009, 03/01/2010, 03/03/2011, 02/02/2012, 02/28/2013, 01/16/2014, 02/08/2015, 04/02/2016, 02/02/2017   HIB (PRP-T) 11/09/2001, 01/03/2002, 03/30/2002, 12/14/2002   HPV 9-valent 04/12/2014, 06/15/2014, 11/07/2014   Hepatitis A, Ped/Adol-2 Dose 11/28/2005, 12/02/2006   Hepatitis B, PED/ADOLESCENT 10-25-01, 10/12/2001, 07/04/2002   IPV 11/09/2001, 01/03/2002, 11/28/2005   Influenza, Quadrivalent, Recombinant, Inj, Pf 02/10/2019   Influenza,inj,Quad PF,6+ Mos 04/23/2018   Influenza,trivalent, recombinat, inj, PF 02/12/2023   MMR 10/03/2002, 11/28/2005   Meningococcal Acwy, Unspecified 10/15/2012   Meningococcal B, OMV 02/10/2019, 06/29/2019   Meningococcal Conjugate 02/10/2019   Pfizer(Comirnaty)Fall Seasonal Vaccine 12 years and older 07/26/2019, 08/16/2019  Pneumococcal Conjugate PCV 7 11/09/2001, 01/03/2002, 12/14/2002, 09/26/2003   Tdap 10/15/2012, 11/16/2018   Varicella 10/03/2002, 11/28/2005     Problem List:   Patient Active Problem List   Diagnosis Date Noted   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   Acne vulgaris 04/01/2024   Allergic rhinitis 04/01/2024   Anxiety 04/01/2024   Inflammatory arthritis 04/01/2024   Irregular menses 04/01/2024   Migraine 04/01/2024   Mild intermittent asthma 04/01/2024   Plantar fascial fibromatosis 04/01/2024   Vitamin B12 deficiency (non anemic) 04/01/2024   Vitamin D deficiency 04/01/2024   Dysmenorrhea 04/01/2024   Gastroesophageal reflux disease without esophagitis 04/01/2024   Elevated sed rate 04/01/2024   Chronic low back pain 04/01/2024   Inflammatory back pain 04/01/2024   Panic disorder 04/13/2019   Generalized anxiety disorder 04/13/2019   Social anxiety disorder 04/13/2019   Attention deficit hyperactivity disorder (ADHD), inattentive type, 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   Persistent depressive disorder with atypical features, 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   Postconcussional syndrome 07/15/2017   Acute post-traumatic headache, not intractable 07/15/2017   Poor concentration 07/15/2017    History: Past Medical History:  Diagnosis Date   ADHD (attention deficit hyperactivity disorder)    Allergic rhinitis with asthma without status asthmaticus without complication    Anxiety    Asthma    Depression    Dysmenorrhea in adolescent    Fatigue    GERD (gastroesophageal reflux disease)    Headache    IBS (irritable bowel syndrome)    Post concussion syndrome     Family History  Problem Relation Age of Onset   Depression Mother    Depression Father    Post-traumatic stress disorder Father    ADD / ADHD Brother    Depression Brother    Tics Brother    Alcohol abuse Paternal Aunt    Alcohol abuse Paternal Uncle    Alcohol abuse Paternal Grandfather    Past Surgical History:  Procedure Laterality Date   ADENOIDECTOMY  2004   CHOLECYSTECTOMY     COLONOSCOPY WITH ESOPHAGOGASTRODUODENOSCOPY (EGD)     TYMPANOSTOMY TUBE  PLACEMENT  2004   Social History   Social History Narrative   Not on file    Objective: Vital Signs: BP (!) 88/57   Pulse (!) 105   Temp 98.5 F (36.9 C)   Resp 16   Ht 5' 7.5 (1.715 m)   Wt 134 lb 9.6 oz (61.1 kg)   LMP 03/28/2024 (Approximate)   BMI 20.77 kg/m   Physical Exam Constitutional:      General: She is not in acute distress.    Appearance: Normal appearance.  HENT:     Head: Normocephalic and atraumatic.  Eyes:     General: Lids are normal. No scleral icterus.    Conjunctiva/sclera: Conjunctivae normal.     Pupils: Pupils are equal, round, and reactive to light.  Cardiovascular:     Pulses:          Dorsalis pedis pulses are 2+ on the left side.  Pulmonary:     Effort: Pulmonary effort is normal.  Musculoskeletal:     Left foot: Decreased range of motion.  Feet:     Left foot:     Skin integrity: Skin integrity normal.     Toenail Condition: Left toenails are normal.     Comments: Tenderness and swellingnoted across the top of the foot and MTPs. 5/5 strength with ankle dorsiflexion and plantarflexion. Limited ROM.  Skin:    General: Skin is warm.  Findings: No rash.  Neurological:     Mental Status: She is alert.  Psychiatric:        Mood and Affect: Mood normal.        Behavior: Behavior normal. Behavior is cooperative.     Imaging: No results found.  Assessment & Plan Non-radiographic axial spondyloarthritis (HCC) Symptoms most suspicious for axial spondyloarthritis. Symptoms remain uncontrolled despite NSAIDs and several prednisone  tapers. GI side effects effects with sulfaslazine. Discussed starting Cimzia. Risks, benefits, and alternatives to treatment with Cimzia discussed with patient. She wishes to retry sulfasalazine  to see if the side effects continue to occcur. If she does continue to have side effects, she is agreeable to starting Cimzia at that time. No blood work today. Will have her come in for a lab visit if starting Cimzia to  test for Hep B & C and tuberculosis. Patient is agreeable to this plan.  -Continue sulfasalazine  500 mg po BID with a meal. -Discussed side effects of Cimzia including site reactions and increased risk of infection. Patient denies history of seizures, multiple sclerosis, and heart failure; discussed that all of those conditions could be worsened by treatment with TNF inhibitors. W test for Hepatitis B, Hepatitis C, and TB before initiating treatment. Patient was counseled to hold the medication when febrile or with signs/symptoms of infection. -Counseled patient on ways to mitigate injection site reactions, including icing the injection site for 10 minutes prior and taking Zyrtec or Claritin daily for 2 days prior to injections.  Acute pain of left foot She has experienced episodic joint swelling and pain for approximately three years. Swelling and tenderness present in left foot today on exam. I think she needs a longer course of prednisone  to decrease the inflammation while she is getting started on sulfasalazine , especially given the fact she will be on a business trip requiring her to walk short distances which she is currently struggling to do.  -Start prednisone  20 mg taper for 12 days. -Discontinue meloxicam  while on prednisone .   Meds ordered this encounter  Medications   predniSONE  (DELTASONE ) 5 MG tablet    Sig: Take 4 tablets (20 mg total) by mouth daily with breakfast for 3 days, THEN 3 tablets (15 mg total) daily with breakfast for 3 days, THEN 2 tablets (10 mg total) daily with breakfast for 3 days, THEN 1 tablet (5 mg total) daily with breakfast for 3 days.    Dispense:  30 tablet    Refill:  0     Follow-Up Instructions:  Return in about 6 years (around 05/03/2030) for axial spondyloarthritis.    Procedures: No procedures performed  Daved GORMAN Holstein, PA-C  Note - This record has been created using [employer]. Chart creation errors have been sought, but may not always have  been located. Such creation errors do not reflect on the standard of medical care.      [1]  Current Outpatient Medications:    acetaminophen  (TYLENOL ) 325 MG tablet, Take 650 mg by mouth as needed., Disp: , Rfl:    Cholecalciferol (VITAMIN D-3 PO), Take by mouth daily., Disp: , Rfl:    fluticasone  (FLONASE ) 50 MCG/ACT nasal spray, Place 2 sprays into both nostrils daily., Disp: 16 g, Rfl: 3   meloxicam  (MOBIC ) 15 MG tablet, Take 1 tablet (15 mg total) by mouth daily., Disp: 30 tablet, Rfl: 0   predniSONE  (DELTASONE ) 5 MG tablet, Take 4 tablets (20 mg total) by mouth daily with breakfast for 3 days, THEN 3 tablets (  15 mg total) daily with breakfast for 3 days, THEN 2 tablets (10 mg total) daily with breakfast for 3 days, THEN 1 tablet (5 mg total) daily with breakfast for 3 days., Disp: 30 tablet, Rfl: 0   sulfaSALAzine  (AZULFIDINE ) 500 MG EC tablet, Take 1 tablet (500 mg total) by mouth 2 (two) times daily., Disp: 60 tablet, Rfl: 0   tiZANidine  (ZANAFLEX ) 4 MG tablet, Take 1 tablet (4 mg total) by mouth at bedtime as needed., Disp: 90 tablet, Rfl: 0  "

## 2024-05-03 ENCOUNTER — Ambulatory Visit

## 2024-05-03 ENCOUNTER — Other Ambulatory Visit (HOSPITAL_BASED_OUTPATIENT_CLINIC_OR_DEPARTMENT_OTHER): Payer: Self-pay

## 2024-05-03 VITALS — BP 88/57 | HR 105 | Temp 98.5°F | Resp 16 | Ht 67.5 in | Wt 134.6 lb

## 2024-05-03 DIAGNOSIS — M45A0 Non-radiographic axial spondyloarthritis of unspecified sites in spine: Secondary | ICD-10-CM | POA: Diagnosis not present

## 2024-05-03 DIAGNOSIS — M79672 Pain in left foot: Secondary | ICD-10-CM | POA: Diagnosis not present

## 2024-05-03 DIAGNOSIS — G8929 Other chronic pain: Secondary | ICD-10-CM

## 2024-05-03 DIAGNOSIS — M1380 Other specified arthritis, unspecified site: Secondary | ICD-10-CM

## 2024-05-03 MED ORDER — PREDNISONE 5 MG PO TABS
ORAL_TABLET | ORAL | 0 refills | Status: AC
  Filled 2024-05-03: qty 30, 12d supply, fill #0

## 2024-05-03 NOTE — Assessment & Plan Note (Signed)
 Symptoms most suspicious for axial spondyloarthritis. Symptoms remain uncontrolled despite NSAIDs and several prednisone  tapers. GI side effects effects with sulfaslazine. Discussed starting Cimzia. Risks, benefits, and alternatives to treatment with Cimzia discussed with patient. She wishes to retry sulfasalazine  to see if the side effects continue to occcur. If she does continue to have side effects, she is agreeable to starting Cimzia at that time. No blood work today. Will have her come in for a lab visit if starting Cimzia to test for Hep B & C and tuberculosis. Patient is agreeable to this plan.  -Continue sulfasalazine  500 mg po BID with a meal. -Discussed side effects of Cimzia including site reactions and increased risk of infection. Patient denies history of seizures, multiple sclerosis, and heart failure; discussed that all of those conditions could be worsened by treatment with TNF inhibitors. W test for Hepatitis B, Hepatitis C, and TB before initiating treatment. Patient was counseled to hold the medication when febrile or with signs/symptoms of infection. -Counseled patient on ways to mitigate injection site reactions, including icing the injection site for 10 minutes prior and taking Zyrtec or Claritin daily for 2 days prior to injections.

## 2024-05-16 ENCOUNTER — Ambulatory Visit

## 2024-06-15 ENCOUNTER — Ambulatory Visit
